# Patient Record
Sex: Female | Born: 1990
Health system: Southern US, Community
[De-identification: ages and names within clinical notes are randomized; demographics above are authoritative.]

## PROBLEM LIST (undated history)

## (undated) ENCOUNTER — Emergency Department (HOSPITAL_COMMUNITY): Admission: EM | Payer: BLUE CROSS/BLUE SHIELD

## (undated) DIAGNOSIS — R87629 Unspecified abnormal cytological findings in specimens from vagina: Secondary | ICD-10-CM

## (undated) DIAGNOSIS — T1490XA Injury, unspecified, initial encounter: Secondary | ICD-10-CM

## (undated) DIAGNOSIS — R569 Unspecified convulsions: Secondary | ICD-10-CM

## (undated) DIAGNOSIS — G43909 Migraine, unspecified, not intractable, without status migrainosus: Secondary | ICD-10-CM

## (undated) DIAGNOSIS — K219 Gastro-esophageal reflux disease without esophagitis: Secondary | ICD-10-CM

## (undated) DIAGNOSIS — K449 Diaphragmatic hernia without obstruction or gangrene: Secondary | ICD-10-CM

## (undated) DIAGNOSIS — A048 Other specified bacterial intestinal infections: Secondary | ICD-10-CM

## (undated) DIAGNOSIS — D649 Anemia, unspecified: Secondary | ICD-10-CM

## (undated) DIAGNOSIS — I219 Acute myocardial infarction, unspecified: Secondary | ICD-10-CM

## (undated) DIAGNOSIS — J302 Other seasonal allergic rhinitis: Secondary | ICD-10-CM

## (undated) HISTORY — DX: Acute myocardial infarction, unspecified: I21.9

## (undated) HISTORY — DX: Injury, unspecified, initial encounter: T14.90XA

## (undated) HISTORY — DX: Diaphragmatic hernia without obstruction or gangrene: K44.9

## (undated) HISTORY — DX: Other seasonal allergic rhinitis: J30.2

## (undated) HISTORY — DX: Gastro-esophageal reflux disease without esophagitis: K21.9

## (undated) HISTORY — DX: Unspecified abnormal cytological findings in specimens from vagina: R87.629

## (undated) HISTORY — DX: Other specified bacterial intestinal infections: A04.8

## (undated) HISTORY — DX: Unspecified convulsions: R56.9

## (undated) HISTORY — DX: Anemia, unspecified: D64.9

## (undated) HISTORY — DX: Migraine, unspecified, not intractable, without status migrainosus: G43.909

---

## 2013-09-18 DIAGNOSIS — I219 Acute myocardial infarction, unspecified: Secondary | ICD-10-CM

## 2013-09-18 HISTORY — DX: Acute myocardial infarction, unspecified: I21.9

## 2016-04-20 HISTORY — PX: ESOPHAGOGASTRODUODENOSCOPY: SHX1529

## 2016-04-20 HISTORY — PX: COLONOSCOPY: SHX174

## 2017-07-22 ENCOUNTER — Other Ambulatory Visit (HOSPITAL_COMMUNITY)
Admission: RE | Admit: 2017-07-22 | Discharge: 2017-07-22 | Disposition: A | Discharge status: Home / Self Care | Payer: BLUE CROSS/BLUE SHIELD | Source: Ambulatory Visit | Attending: Adult Health | Admitting: Adult Health

## 2017-07-22 ENCOUNTER — Encounter: Payer: Self-pay | Admitting: Adult Health

## 2017-07-22 ENCOUNTER — Ambulatory Visit (INDEPENDENT_AMBULATORY_CARE_PROVIDER_SITE_OTHER): Payer: BLUE CROSS/BLUE SHIELD | Admitting: Adult Health

## 2017-07-22 VITALS — BP 110/80 | HR 77 | Ht 63.0 in | Wt 264.0 lb

## 2017-07-22 DIAGNOSIS — N6311 Unspecified lump in the right breast, upper outer quadrant: Secondary | ICD-10-CM | POA: Insufficient documentation

## 2017-07-22 DIAGNOSIS — Z01411 Encounter for gynecological examination (general) (routine) with abnormal findings: Secondary | ICD-10-CM

## 2017-07-22 DIAGNOSIS — F329 Major depressive disorder, single episode, unspecified: Secondary | ICD-10-CM | POA: Diagnosis not present

## 2017-07-22 DIAGNOSIS — Z1151 Encounter for screening for human papillomavirus (HPV): Secondary | ICD-10-CM | POA: Diagnosis not present

## 2017-07-22 DIAGNOSIS — Z01419 Encounter for gynecological examination (general) (routine) without abnormal findings: Secondary | ICD-10-CM | POA: Diagnosis present

## 2017-07-22 DIAGNOSIS — N926 Irregular menstruation, unspecified: Secondary | ICD-10-CM | POA: Insufficient documentation

## 2017-07-22 DIAGNOSIS — Z6841 Body Mass Index (BMI) 40.0 and over, adult: Secondary | ICD-10-CM | POA: Insufficient documentation

## 2017-07-22 DIAGNOSIS — F32A Depression, unspecified: Secondary | ICD-10-CM | POA: Insufficient documentation

## 2017-07-22 DIAGNOSIS — E669 Obesity, unspecified: Secondary | ICD-10-CM | POA: Diagnosis not present

## 2017-07-22 MED ORDER — ESCITALOPRAM OXALATE 10 MG PO TABS: 10.0000 mg | ORAL_TABLET | Freq: Every day | ORAL | 12 refills | Status: DC

## 2017-07-22 NOTE — Progress Notes (Signed)
Patient ID: Jeanne Herring, female   DOB: 1990/08/29, 27 y.o.   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 PCP is Dr Jeanne Herring in [place].    Current Medications, Allergies, Past Medical History, Past Surgical History, Family History and Social History were reviewed in [employer] record.     Review of Systems:  Patient denies any headaches, hearing loss, fatigue, blurred vision, shortness of breath, chest pain, abdominal pain, problems with  urination, or intercourse. No joint pain or mood swings. +irregular periods  Has BMs once a week unless takes meds, has had colonoscopy and EGD.   Physical Exam:BP 110/80 (BP Location: Left Arm, Patient Position: Sitting, Cuff Size: Large)   Pulse 77   Ht 5\' 3"  (1.6 m)   Wt 264 lb (119.7 kg)   BMI 46.77 kg/m  General:  Well developed, well nourished, no acute distress Skin:  Warm and dry,numerous tattoos  Neck:  Midline trachea, normal thyroid, good ROM, no lymphadenopathy Lungs; Clear to auscultation bilaterally Breast:  No dominant palpable mass, retraction, or nipple discharge on left, no retraction or nipple discharge on right, has 2 cm mobile,tender mass at 10 o' clock,2 FB from nipple  Cardiovascular: Regular rate and rhythm Abdomen:  Soft, non tender, no hepatosplenomegaly,obese Pelvic:  External genitalia is normal in appearance, no lesions.  The vagina is normal in appearance.+pink discharge. Urethra has no lesions or masses. The cervix is smooth, pap with GC/CHL performed.  Uterus is felt to be normal size, shape, and contour.  No adnexal masses or tenderness noted.Bladder is non tender, no masses felt. Extremities/musculoskeletal:  No swelling or varicosities noted, no clubbing or cyanosis Psych:  No mood changes, alert and cooperative,seems happy PHQ 9 score 13, denies being suicidal, and  is open to meds. Will try lexapro, she declines counseling, has done that in past.  Impression: 1. Encounter for gynecological examination with Papanicolaou smear of cervix   2. Mass of upper outer quadrant of right breast   3. Depression, unspecified depression type   4. Morbid obesity (HCC)   5. Irregular periods       Plan: Right breast US  4/16 at 4:10 pm Meds ordered this encounter  Medications  . escitalopram (LEXAPRO) 10 MG tablet    Sig: Take 1 tablet (10 mg total) by mouth daily.    Dispense:  30 tablet    Refill:  12    Order Specific Question:   Supervising Provider    Answer:   Jeanne Herring [[id number]]  Follow up in 4 weeks  Physical in 1 year Pap in 3 if normal Try eating more often Try increasing water  Keep period calendar Try miralax daily

## 2017-07-27 LAB — CYTOLOGY - PAP
Adequacy: ABSENT
Chlamydia: NEGATIVE
Diagnosis: NEGATIVE
HPV: NOT DETECTED
Neisseria Gonorrhea: NEGATIVE

## 2017-08-03 ENCOUNTER — Other Ambulatory Visit (HOSPITAL_COMMUNITY): Payer: Self-pay

## 2017-08-10 ENCOUNTER — Ambulatory Visit (HOSPITAL_COMMUNITY)
Admission: RE | Admit: 2017-08-10 | Discharge: 2017-08-10 | Disposition: A | Discharge status: Home / Self Care | Payer: BLUE CROSS/BLUE SHIELD | Source: Ambulatory Visit | Attending: Adult Health | Admitting: Adult Health

## 2017-08-10 ENCOUNTER — Other Ambulatory Visit: Payer: Self-pay | Admitting: Adult Health

## 2017-08-10 DIAGNOSIS — IMO0002 Reserved for concepts with insufficient information to code with codable children: Secondary | ICD-10-CM

## 2017-08-10 DIAGNOSIS — R928 Other abnormal and inconclusive findings on diagnostic imaging of breast: Secondary | ICD-10-CM | POA: Diagnosis not present

## 2017-08-10 DIAGNOSIS — N6311 Unspecified lump in the right breast, upper outer quadrant: Secondary | ICD-10-CM

## 2017-08-10 DIAGNOSIS — R229 Localized swelling, mass and lump, unspecified: Principal | ICD-10-CM

## 2017-08-19 ENCOUNTER — Encounter: Payer: Self-pay | Admitting: Adult Health

## 2017-08-19 ENCOUNTER — Ambulatory Visit: Payer: BLUE CROSS/BLUE SHIELD | Admitting: Adult Health

## 2017-08-19 VITALS — BP 114/70 | HR 100 | Ht 63.0 in | Wt 265.0 lb

## 2017-08-19 DIAGNOSIS — Z131 Encounter for screening for diabetes mellitus: Secondary | ICD-10-CM | POA: Diagnosis not present

## 2017-08-19 DIAGNOSIS — F32A Depression, unspecified: Secondary | ICD-10-CM

## 2017-08-19 DIAGNOSIS — F329 Major depressive disorder, single episode, unspecified: Secondary | ICD-10-CM | POA: Diagnosis not present

## 2017-08-19 DIAGNOSIS — R635 Abnormal weight gain: Secondary | ICD-10-CM

## 2017-08-19 DIAGNOSIS — Z889 Allergy status to unspecified drugs, medicaments and biological substances status: Secondary | ICD-10-CM | POA: Insufficient documentation

## 2017-08-19 DIAGNOSIS — L680 Hirsutism: Secondary | ICD-10-CM

## 2017-08-19 MED ORDER — SPIRONOLACTONE 100 MG PO TABS: 100.0000 mg | ORAL_TABLET | Freq: Every day | ORAL | 2 refills | Status: DC

## 2017-08-19 NOTE — Progress Notes (Signed)
  Subjective:     Patient ID: Jeanne Herring, female   DOB: May 06, 1990, 27 y.o.   MRN: [id number]  HPI Khaliah is a 27 year old black female in for follow up on starting lexapro and is better, but having crazy dreams.She had mammogram, was negative but had fatty tissue.She is requesting meds for allergy cough and facial hair.Has gained weight, and is eating more fruit and drinking water.  Review of Systems Feels better on lexapro, but having crazy dreams +hair on face and chest +cough +weight gain   Reviewed past medical,surgical, social and family history. Reviewed medications and allergies.  Objective:   Physical Exam BP 114/70 (BP Location: Left Arm, Patient Position: Sitting, Cuff Size: Large)   Pulse 100   Ht 5\' 3"  (1.6 m)   Wt 265 lb (120.2 kg)   LMP 07/26/2017   BMI 46.94 kg/m  Skin warm and dry. Lungs: clear to ausculation bilaterally. Cardiovascular: regular rate and rhythm.+facial hair and on chest, has used Darene Lamer. She does have apple shape. PHQ 2 score 0 today, PHQ 9 score was 13, on 4/4.   Will continue lexapro for now and Rx spirolactone to see if helps hair growth.   Assessment:     1. Depression, unspecified depression type   2. Hirsutism   3. H/O seasonal allergies   4. Weight gain   5. Screening for diabetes mellitus       Plan:     Check CBC,CMP,TSH and A1c Continue lexapro Meds ordered this encounter  Medications  . spironolactone (ALDACTONE) 100 MG tablet    Sig: Take 1 tablet (100 mg total) by mouth daily.    Dispense:  30 tablet    Refill:  2    Order Specific Question:   Supervising Provider    Answer:   Duane Lope H [[id number]]  F/U in 8 weeks  Try zyrtec and delsym  Try [employer] app

## 2017-08-20 LAB — COMPREHENSIVE METABOLIC PANEL
ALT: 39 IU/L — ABNORMAL HIGH (ref 0–32)
AST: 39 IU/L (ref 0–40)
Albumin/Globulin Ratio: 1.2 (ref 1.2–2.2)
Albumin: 4.3 g/dL (ref 3.5–5.5)
Alkaline Phosphatase: 81 IU/L (ref 39–117)
BUN/Creatinine Ratio: 5 — ABNORMAL LOW (ref 9–23)
BUN: 4 mg/dL — ABNORMAL LOW (ref 6–20)
Bilirubin Total: 0.5 mg/dL (ref 0.0–1.2)
CO2: 25 mmol/L (ref 20–29)
Calcium: 9.3 mg/dL (ref 8.7–10.2)
Chloride: 100 mmol/L (ref 96–106)
Creatinine, Ser: 0.78 mg/dL (ref 0.57–1.00)
GFR calc Af Amer: 120 mL/min/{1.73_m2} (ref >59)
GFR calc non Af Amer: 104 mL/min/{1.73_m2} (ref >59)
Globulin, Total: 3.6 g/dL (ref 1.5–4.5)
Glucose: 96 mg/dL (ref 65–99)
Potassium: 4.2 mmol/L (ref 3.5–5.2)
Sodium: 139 mmol/L (ref 134–144)
Total Protein: 7.9 g/dL (ref 6.0–8.5)

## 2017-08-20 LAB — HEMOGLOBIN A1C
Est. average glucose Bld gHb Est-mCnc: 114 mg/dL
Hgb A1c MFr Bld: 5.6 % (ref 4.8–5.6)

## 2017-08-20 LAB — CBC
Hematocrit: 43.4 % (ref 34.0–46.6)
Hemoglobin: 13.3 g/dL (ref 11.1–15.9)
MCH: 24.1 pg — ABNORMAL LOW (ref 26.6–33.0)
MCHC: 30.6 g/dL — ABNORMAL LOW (ref 31.5–35.7)
MCV: 79 fL (ref 79–97)
Platelets: 205 10*3/uL (ref 150–379)
RBC: 5.51 x10E6/uL — ABNORMAL HIGH (ref 3.77–5.28)
RDW: 15.1 % (ref 12.3–15.4)
WBC: 9.2 10*3/uL (ref 3.4–10.8)

## 2017-08-20 LAB — TSH: TSH: 0.642 u[IU]/mL (ref 0.450–4.500)

## 2017-08-25 ENCOUNTER — Telehealth: Payer: Self-pay | Admitting: *Deleted

## 2017-08-25 NOTE — Telephone Encounter (Signed)
Left message about labs and to call me

## 2017-08-26 ENCOUNTER — Telehealth: Payer: Self-pay | Admitting: Adult Health

## 2017-08-26 ENCOUNTER — Telehealth: Payer: Self-pay | Admitting: *Deleted

## 2017-08-26 MED ORDER — SERTRALINE HCL 50 MG PO TABS: 50.0000 mg | ORAL_TABLET | Freq: Every day | ORAL | 1 refills | Status: DC

## 2017-08-26 NOTE — Telephone Encounter (Signed)
Pt having vivid dreams with lexapro, will change to zoloft

## 2017-08-26 NOTE — Telephone Encounter (Signed)
Jeanne Herring to talk with patient.

## 2017-09-08 ENCOUNTER — Telehealth: Payer: Self-pay | Admitting: Adult Health

## 2017-09-08 NOTE — Telephone Encounter (Signed)
Pt called and stated that she started her period on 4/26 and has been bleeding since that time. It is not always heavy, sometimes when she goes to check pad it is dry but she does see some bleeding everyday. Pt is not on birth control. Pt advised to make an appointment to see Victorino Dike prior to the appt she has made in June. Patient agrees. Transferred to front desk to make appt.

## 2017-09-08 NOTE — Telephone Encounter (Signed)
Patient called stating that she has been bleeding for a long time and would like to speak with a nurse regarding this. Please contact pt

## 2017-09-20 ENCOUNTER — Telehealth: Payer: Self-pay | Admitting: Adult Health

## 2017-09-20 NOTE — Telephone Encounter (Signed)
Pt called stating that she has been bleeding since 4/26. She states that she has an appt with Victorino Dike on 6/11. She first stated that the bleeding had increased and she was changing 4-5 pads a day. She then states that she had been changing a pad every 30 min to an hour yesterday. Advised that I would transfer her to appts to see if we had anything sooner. Advised that if she was still changing pads every 30 min to 1 hour then she should go to urgent care or the ED. Pt verbalized understanding.

## 2017-09-20 NOTE — Telephone Encounter (Signed)
PT CALLED AND IS WANTING A NURSE TO GIVE HER A CALL

## 2017-09-21 ENCOUNTER — Encounter (HOSPITAL_COMMUNITY): Payer: Self-pay | Admitting: Emergency Medicine

## 2017-09-21 ENCOUNTER — Emergency Department (HOSPITAL_COMMUNITY): Payer: BLUE CROSS/BLUE SHIELD

## 2017-09-21 ENCOUNTER — Other Ambulatory Visit: Payer: Self-pay

## 2017-09-21 ENCOUNTER — Emergency Department (HOSPITAL_COMMUNITY)
Admission: EM | Admit: 2017-09-21 | Discharge: 2017-09-22 | Disposition: A | Discharge status: Home / Self Care | Payer: BLUE CROSS/BLUE SHIELD | Attending: Emergency Medicine | Admitting: Emergency Medicine

## 2017-09-21 DIAGNOSIS — M542 Cervicalgia: Secondary | ICD-10-CM | POA: Insufficient documentation

## 2017-09-21 DIAGNOSIS — Z79899 Other long term (current) drug therapy: Secondary | ICD-10-CM | POA: Insufficient documentation

## 2017-09-21 DIAGNOSIS — I252 Old myocardial infarction: Secondary | ICD-10-CM | POA: Insufficient documentation

## 2017-09-21 DIAGNOSIS — Z91040 Latex allergy status: Secondary | ICD-10-CM | POA: Insufficient documentation

## 2017-09-21 DIAGNOSIS — N760 Acute vaginitis: Secondary | ICD-10-CM | POA: Insufficient documentation

## 2017-09-21 DIAGNOSIS — B9689 Other specified bacterial agents as the cause of diseases classified elsewhere: Secondary | ICD-10-CM

## 2017-09-21 DIAGNOSIS — N939 Abnormal uterine and vaginal bleeding, unspecified: Secondary | ICD-10-CM

## 2017-09-21 LAB — URINALYSIS, ROUTINE W REFLEX MICROSCOPIC
Bacteria, UA: NONE SEEN
Bilirubin Urine: NEGATIVE
Glucose, UA: NEGATIVE mg/dL
Ketones, ur: NEGATIVE mg/dL
Leukocytes, UA: NEGATIVE
Nitrite: NEGATIVE
Protein, ur: NEGATIVE mg/dL
Specific Gravity, Urine: 1.011 (ref 1.005–1.030)
pH: 7 (ref 5.0–8.0)

## 2017-09-21 LAB — COMPREHENSIVE METABOLIC PANEL
ALT: 39 U/L (ref 14–54)
AST: 35 U/L (ref 15–41)
Albumin: 3.7 g/dL (ref 3.5–5.0)
Alkaline Phosphatase: 63 U/L (ref 38–126)
Anion gap: 7 (ref 5–15)
BUN: 6 mg/dL (ref 6–20)
CO2: 26 mmol/L (ref 22–32)
Calcium: 9.2 mg/dL (ref 8.9–10.3)
Chloride: 105 mmol/L (ref 101–111)
Creatinine, Ser: 0.69 mg/dL (ref 0.44–1.00)
GFR calc Af Amer: >60 mL/min (ref >60)
GFR calc non Af Amer: >60 mL/min (ref >60)
Glucose, Bld: 111 mg/dL — ABNORMAL HIGH (ref 65–99)
Potassium: 3.7 mmol/L (ref 3.5–5.1)
Sodium: 138 mmol/L (ref 135–145)
Total Bilirubin: 0.4 mg/dL (ref 0.3–1.2)
Total Protein: 7.7 g/dL (ref 6.5–8.1)

## 2017-09-21 LAB — CBC WITH DIFFERENTIAL/PLATELET
Basophils Absolute: 0 10*3/uL (ref 0.0–0.1)
Basophils Relative: 0 %
Eosinophils Absolute: 0.1 10*3/uL (ref 0.0–0.7)
Eosinophils Relative: 2 %
HCT: 37.2 % (ref 36.0–46.0)
Hemoglobin: 11.5 g/dL — ABNORMAL LOW (ref 12.0–15.0)
Lymphocytes Relative: 41 %
Lymphs Abs: 3.2 10*3/uL (ref 0.7–4.0)
MCH: 24.2 pg — ABNORMAL LOW (ref 26.0–34.0)
MCHC: 30.9 g/dL (ref 30.0–36.0)
MCV: 78.2 fL (ref 78.0–100.0)
Monocytes Absolute: 0.6 10*3/uL (ref 0.1–1.0)
Monocytes Relative: 7 %
Neutro Abs: 4 10*3/uL (ref 1.7–7.7)
Neutrophils Relative %: 50 %
Platelets: 214 10*3/uL (ref 150–400)
RBC: 4.76 MIL/uL (ref 3.87–5.11)
RDW: 13.4 % (ref 11.5–15.5)
WBC: 7.9 10*3/uL (ref 4.0–10.5)

## 2017-09-21 LAB — WET PREP, GENITAL
Sperm: NONE SEEN
Trich, Wet Prep: NONE SEEN
Yeast Wet Prep HPF POC: NONE SEEN

## 2017-09-21 LAB — POC URINE PREG, ED: Preg Test, Ur: NEGATIVE

## 2017-09-21 LAB — POC OCCULT BLOOD, ED: Fecal Occult Bld: NEGATIVE

## 2017-09-21 MED ORDER — IOPAMIDOL (ISOVUE-370) INJECTION 76%
75.0000 mL | Freq: Once | INTRAVENOUS | Status: AC | PRN
  Administered 2017-09-21: 75 mL via INTRAVENOUS

## 2017-09-21 MED ORDER — METRONIDAZOLE 500 MG PO TABS
500.0000 mg | ORAL_TABLET | Freq: Once | ORAL | Status: AC
  Administered 2017-09-21: 500 mg via ORAL
  Filled 2017-09-21: qty 1

## 2017-09-21 MED ORDER — METRONIDAZOLE 500 MG PO TABS: 500.0000 mg | ORAL_TABLET | Freq: Two times a day (BID) | ORAL | 0 refills | Status: DC

## 2017-09-21 NOTE — ED Triage Notes (Signed)
Vaginal bleeding since April 26th. Also complaining of bleeding per rectum. Pt also having complaints right side of neck pain for past two hours.
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
## 2017-09-23 LAB — GC/CHLAMYDIA PROBE AMP (~~LOC~~) NOT AT ARMC
Chlamydia: NEGATIVE
Neisseria Gonorrhea: NEGATIVE

## 2017-09-24 ENCOUNTER — Ambulatory Visit: Payer: BLUE CROSS/BLUE SHIELD | Admitting: Adult Health

## 2017-09-28 ENCOUNTER — Ambulatory Visit (INDEPENDENT_AMBULATORY_CARE_PROVIDER_SITE_OTHER): Payer: BLUE CROSS/BLUE SHIELD | Admitting: Adult Health

## 2017-09-28 ENCOUNTER — Encounter: Payer: Self-pay | Admitting: Adult Health

## 2017-09-28 ENCOUNTER — Other Ambulatory Visit: Payer: Self-pay

## 2017-09-28 VITALS — BP 112/69 | HR 73 | Ht 63.0 in | Wt 265.0 lb

## 2017-09-28 DIAGNOSIS — M79601 Pain in right arm: Secondary | ICD-10-CM | POA: Diagnosis not present

## 2017-09-28 DIAGNOSIS — R112 Nausea with vomiting, unspecified: Secondary | ICD-10-CM | POA: Diagnosis not present

## 2017-09-28 DIAGNOSIS — N939 Abnormal uterine and vaginal bleeding, unspecified: Secondary | ICD-10-CM | POA: Diagnosis not present

## 2017-09-28 DIAGNOSIS — R1011 Right upper quadrant pain: Secondary | ICD-10-CM

## 2017-09-28 NOTE — Progress Notes (Signed)
  Subjective:     Patient ID: Jeanne Herring, female   DOB: May 07, 1990, 27 y.o.   MRN: [id number]  HPI Jeanne Herring is a 27 year old black female in complaining of bleeding since 4/26 but stopped yesterday, was seen in ER 09/21/17 and treated for BV.  PCP is Dr Mayford Knife in [place].   Review of Systems +bleeding, but stopped yesterday +stomach and back pain, with nausea and vomiting at times Pain in right arm, feels numb and has knot near wrist Reviewed past medical,surgical, social and family history. Reviewed medications and allergies.     Objective:   Physical Exam BP 112/69 (BP Location: Right Arm, Patient Position: Sitting, Cuff Size: Large)   Pulse 73   Ht 5\' 3"  (1.6 m)   Wt 265 lb (120.2 kg)   LMP 08/13/2017   BMI 46.94 kg/m  Skin warm and dry.Pelvic: external genitalia is normal in appearance no lesions, vagina: no discharge or odor,urethra has no lesions or masses noted, cervix:smooth, no CMT, uterus: normal size, shape and contour, non tender, no masses felt, adnexa: no masses or tenderness noted. Bladder is non tender and no masses felt. Abdomen is soft and has tenderness RUQ and epigastric area, no masses felt, no CVAT but tender low back. Has ?cyst on ulna near wrist on right.    Assessment:     1. Abnormal uterine bleeding (AUB)   2. RUQ pain   3. Nausea and vomiting, intractability of vomiting not specified, unspecified vomiting type   4. Pain of right upper extremity       Plan:     Abdominal complete and pelvic US scheduled at [facility] 6/14 at 8:15 am Referred to Dr Romeo Apple F/U in 2 weeks

## 2017-09-29 ENCOUNTER — Telehealth: Payer: Self-pay | Admitting: Adult Health

## 2017-09-29 NOTE — Telephone Encounter (Signed)
Would like to talk toJennifer about getting a referral to a GI DR

## 2017-09-29 NOTE — Telephone Encounter (Signed)
Left message that will wait til after [place] on Friday, to see what results are, before making GI referral

## 2017-10-01 ENCOUNTER — Ambulatory Visit (HOSPITAL_COMMUNITY)
Admission: RE | Admit: 2017-10-01 | Discharge: 2017-10-01 | Disposition: A | Discharge status: Home / Self Care | Payer: BLUE CROSS/BLUE SHIELD | Source: Ambulatory Visit | Attending: Adult Health | Admitting: Adult Health

## 2017-10-01 ENCOUNTER — Encounter: Payer: Self-pay | Admitting: Adult Health

## 2017-10-01 DIAGNOSIS — R1011 Right upper quadrant pain: Secondary | ICD-10-CM | POA: Insufficient documentation

## 2017-10-01 DIAGNOSIS — K760 Fatty (change of) liver, not elsewhere classified: Secondary | ICD-10-CM | POA: Insufficient documentation

## 2017-10-01 DIAGNOSIS — R112 Nausea with vomiting, unspecified: Secondary | ICD-10-CM | POA: Diagnosis present

## 2017-10-01 DIAGNOSIS — N939 Abnormal uterine and vaginal bleeding, unspecified: Secondary | ICD-10-CM

## 2017-10-06 ENCOUNTER — Telehealth: Payer: Self-pay | Admitting: Adult Health

## 2017-10-06 DIAGNOSIS — R1011 Right upper quadrant pain: Secondary | ICD-10-CM

## 2017-10-06 DIAGNOSIS — K760 Fatty (change of) liver, not elsewhere classified: Secondary | ICD-10-CM

## 2017-10-06 NOTE — Telephone Encounter (Signed)
Pt is calling back to check on getting a referral for a GI dr

## 2017-10-06 NOTE — Telephone Encounter (Signed)
Referral placed for GI, Dr Darrick Penna to evaluate fatty liver and RUQ pain

## 2017-10-09 ENCOUNTER — Encounter: Payer: Self-pay | Admitting: Adult Health

## 2017-10-11 ENCOUNTER — Encounter: Payer: Self-pay | Admitting: Adult Health

## 2017-10-12 ENCOUNTER — Ambulatory Visit (INDEPENDENT_AMBULATORY_CARE_PROVIDER_SITE_OTHER): Payer: BLUE CROSS/BLUE SHIELD | Admitting: Adult Health

## 2017-10-12 ENCOUNTER — Ambulatory Visit: Payer: BLUE CROSS/BLUE SHIELD | Admitting: Orthopaedic Surgery

## 2017-10-12 ENCOUNTER — Encounter: Payer: Self-pay | Admitting: Adult Health

## 2017-10-12 ENCOUNTER — Encounter: Payer: Self-pay | Admitting: Gastroenterology

## 2017-10-12 VITALS — BP 121/69 | HR 79 | Ht 63.0 in | Wt 261.0 lb

## 2017-10-12 DIAGNOSIS — K760 Fatty (change of) liver, not elsewhere classified: Secondary | ICD-10-CM | POA: Diagnosis not present

## 2017-10-12 DIAGNOSIS — R1011 Right upper quadrant pain: Secondary | ICD-10-CM

## 2017-10-12 DIAGNOSIS — L680 Hirsutism: Secondary | ICD-10-CM | POA: Diagnosis not present

## 2017-10-12 NOTE — Progress Notes (Signed)
  Subjective:     Patient ID: Jeanne Herring, female   DOB: 1990-11-17, 27 y.o.   MRN: [id number]  HPI Jeanne Herring is a 27 year old black female in for F/U on [place]. Pelvic US normal and abdominal US showed fatty liver. And is on spirolactone for facial hair.  PCP is Dr Mayford Knife.   Review of Systems Still has hair on face +RUQ pain No period since  Bleeding stopped end of May  +back pain Moods better  Reviewed past medical,surgical, social and family history. Reviewed medications and allergies.     Objective:   Physical Exam BP 121/69 (BP Location: Left Arm, Patient Position: Sitting, Cuff Size: Large)   Pulse 79   Ht 5\' 3"  (1.6 m)   Wt 261 lb (118.4 kg)   LMP 08/13/2017 Comment: bleeding stopped end of may  BMI 46.23 kg/m    Skin warm and dry, still has facial hair, abdomen is soft and tender RUQ, has fatty liver on [place] and has appt with Dr Darrick Penna. And has appt with Dr Hilda Lias in July for wrist.then she says back has been hurting and talked with Dr Mayford Knife, can talk it over with Dr Hilda Lias too. Will see back in 4 weeks to assess bleeding profile.  Assessment:     1. Fatty liver   2. RUQ pain   3. Hirsutism       Plan:     F/U in 4 weeks  Continue spirolactone

## 2017-10-14 ENCOUNTER — Ambulatory Visit: Payer: BLUE CROSS/BLUE SHIELD | Admitting: Adult Health

## 2017-10-22 ENCOUNTER — Encounter: Payer: Self-pay | Admitting: Gastroenterology

## 2017-10-23 ENCOUNTER — Emergency Department: Payer: BLUE CROSS/BLUE SHIELD

## 2017-10-23 ENCOUNTER — Emergency Department
Admission: EM | Admit: 2017-10-23 | Discharge: 2017-10-23 | Disposition: A | Discharge status: Home / Self Care | Payer: BLUE CROSS/BLUE SHIELD | Attending: Emergency Medicine | Admitting: Emergency Medicine

## 2017-10-23 ENCOUNTER — Encounter: Payer: Self-pay | Admitting: Emergency Medicine

## 2017-10-23 DIAGNOSIS — Z91040 Latex allergy status: Secondary | ICD-10-CM | POA: Diagnosis not present

## 2017-10-23 DIAGNOSIS — Z79899 Other long term (current) drug therapy: Secondary | ICD-10-CM | POA: Diagnosis not present

## 2017-10-23 DIAGNOSIS — H11422 Conjunctival edema, left eye: Secondary | ICD-10-CM

## 2017-10-23 DIAGNOSIS — R51 Headache: Secondary | ICD-10-CM | POA: Diagnosis present

## 2017-10-23 DIAGNOSIS — G43109 Migraine with aura, not intractable, without status migrainosus: Secondary | ICD-10-CM

## 2017-10-23 DIAGNOSIS — H1132 Conjunctival hemorrhage, left eye: Secondary | ICD-10-CM | POA: Insufficient documentation

## 2017-10-23 LAB — COMPREHENSIVE METABOLIC PANEL
ALT: 32 U/L (ref 0–44)
AST: 34 U/L (ref 15–41)
Albumin: 3.9 g/dL (ref 3.5–5.0)
Alkaline Phosphatase: 61 U/L (ref 38–126)
Anion gap: 9 (ref 5–15)
BUN: 7 mg/dL (ref 6–20)
CO2: 25 mmol/L (ref 22–32)
Calcium: 9 mg/dL (ref 8.9–10.3)
Chloride: 104 mmol/L (ref 98–111)
Creatinine, Ser: 0.77 mg/dL (ref 0.44–1.00)
GFR calc Af Amer: >60 mL/min (ref >60)
GFR calc non Af Amer: >60 mL/min (ref >60)
Glucose, Bld: 80 mg/dL (ref 70–99)
Potassium: 3.7 mmol/L (ref 3.5–5.1)
Sodium: 138 mmol/L (ref 135–145)
Total Bilirubin: 1.1 mg/dL (ref 0.3–1.2)
Total Protein: 7.8 g/dL (ref 6.5–8.1)

## 2017-10-23 LAB — CBC WITH DIFFERENTIAL/PLATELET
Basophils Absolute: 0.1 10*3/uL (ref 0–0.1)
Basophils Relative: 1 %
Eosinophils Absolute: 0 10*3/uL (ref 0–0.7)
Eosinophils Relative: 0 %
HCT: 40.2 % (ref 35.0–47.0)
Hemoglobin: 12.9 g/dL (ref 12.0–16.0)
Lymphocytes Relative: 24 %
Lymphs Abs: 2.6 10*3/uL (ref 1.0–3.6)
MCH: 24.7 pg — ABNORMAL LOW (ref 26.0–34.0)
MCHC: 32.2 g/dL (ref 32.0–36.0)
MCV: 76.9 fL — ABNORMAL LOW (ref 80.0–100.0)
Monocytes Absolute: 0.7 10*3/uL (ref 0.2–0.9)
Monocytes Relative: 6 %
Neutro Abs: 7.2 10*3/uL — ABNORMAL HIGH (ref 1.4–6.5)
Neutrophils Relative %: 69 %
Platelets: 214 10*3/uL (ref 150–440)
RBC: 5.22 MIL/uL — ABNORMAL HIGH (ref 3.80–5.20)
RDW: 13.5 % (ref 11.5–14.5)
WBC: 10.6 10*3/uL (ref 3.6–11.0)

## 2017-10-23 MED ORDER — KETOROLAC TROMETHAMINE 30 MG/ML IJ SOLN
30.0000 mg | Freq: Once | INTRAMUSCULAR | Status: AC
  Administered 2017-10-23: 30 mg via INTRAVENOUS
  Filled 2017-10-23: qty 1

## 2017-10-23 MED ORDER — IOHEXOL 300 MG/ML  SOLN
75.0000 mL | Freq: Once | INTRAMUSCULAR | Status: AC | PRN
  Administered 2017-10-23: 75 mL via INTRAVENOUS
  Filled 2017-10-23: qty 75

## 2017-10-23 MED ORDER — PROMETHAZINE HCL 25 MG/ML IJ SOLN
25.0000 mg | Freq: Once | INTRAMUSCULAR | Status: AC
  Administered 2017-10-23: 25 mg via INTRAVENOUS
  Filled 2017-10-23: qty 1

## 2017-10-23 MED ORDER — FLUORESCEIN SODIUM 1 MG OP STRP
1.0000 | ORAL_STRIP | Freq: Once | OPHTHALMIC | Status: AC
  Administered 2017-10-23: 1 via OPHTHALMIC
  Filled 2017-10-23: qty 1

## 2017-10-23 MED ORDER — DIPHENHYDRAMINE HCL 50 MG/ML IJ SOLN
50.0000 mg | Freq: Once | INTRAMUSCULAR | Status: AC
  Administered 2017-10-23: 50 mg via INTRAVENOUS
  Filled 2017-10-23: qty 1

## 2017-10-23 MED ORDER — TETRACAINE HCL 0.5 % OP SOLN
2.0000 [drp] | Freq: Once | OPHTHALMIC | Status: AC
  Administered 2017-10-23: 2 [drp] via OPHTHALMIC
  Filled 2017-10-23: qty 4

## 2017-10-23 NOTE — ED Notes (Signed)
ED Provider at bedside. 
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
## 2017-10-23 NOTE — ED Notes (Signed)
Pt ambulatory to wheel chair upon discharge. Verbalized understanding of discharge instructions and follow-up care. VSS. Skin warm and dry. A&O x4.  

## 2017-10-23 NOTE — ED Notes (Signed)
Pt back in room from CT scan.

## 2017-10-23 NOTE — ED Triage Notes (Signed)
Patient presents to the ED from Urgent Care for left eye pain, redness and swelling.  Patient states pain began 2 days ago but swelling began yesterday.  Patient is in no obvious distress at this time.

## 2017-11-02 ENCOUNTER — Ambulatory Visit: Payer: BLUE CROSS/BLUE SHIELD | Admitting: Orthopaedic Surgery

## 2017-11-02 ENCOUNTER — Telehealth: Payer: Self-pay | Admitting: Radiology

## 2017-11-02 ENCOUNTER — Ambulatory Visit (INDEPENDENT_AMBULATORY_CARE_PROVIDER_SITE_OTHER): Payer: BLUE CROSS/BLUE SHIELD

## 2017-11-02 ENCOUNTER — Encounter: Payer: Self-pay | Admitting: Orthopaedic Surgery

## 2017-11-02 VITALS — BP 107/73 | HR 68 | Ht 64.0 in | Wt 259.0 lb

## 2017-11-02 DIAGNOSIS — M25511 Pain in right shoulder: Secondary | ICD-10-CM

## 2017-11-02 DIAGNOSIS — Z6841 Body Mass Index (BMI) 40.0 and over, adult: Secondary | ICD-10-CM | POA: Diagnosis not present

## 2017-11-02 DIAGNOSIS — S43006A Unspecified dislocation of unspecified shoulder joint, initial encounter: Secondary | ICD-10-CM

## 2017-11-02 NOTE — Telephone Encounter (Signed)
Called to schedule MRI at [facility] Wed 24th at [facility] arrive at 11:30 Called patient to advise.
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
## 2017-11-08 ENCOUNTER — Encounter: Payer: Self-pay | Admitting: Adult Health

## 2017-11-09 ENCOUNTER — Ambulatory Visit: Payer: BLUE CROSS/BLUE SHIELD | Admitting: Adult Health

## 2017-11-10 ENCOUNTER — Ambulatory Visit (HOSPITAL_COMMUNITY)
Admission: RE | Admit: 2017-11-10 | Discharge: 2017-11-10 | Disposition: A | Discharge status: Home / Self Care | Payer: BLUE CROSS/BLUE SHIELD | Source: Ambulatory Visit | Attending: Orthopaedic Surgery | Admitting: Orthopaedic Surgery

## 2017-11-10 DIAGNOSIS — M25511 Pain in right shoulder: Secondary | ICD-10-CM

## 2017-11-10 DIAGNOSIS — S43401A Unspecified sprain of right shoulder joint, initial encounter: Secondary | ICD-10-CM | POA: Diagnosis not present

## 2017-11-10 DIAGNOSIS — S43004A Unspecified dislocation of right shoulder joint, initial encounter: Secondary | ICD-10-CM | POA: Diagnosis not present

## 2017-11-10 DIAGNOSIS — S43006A Unspecified dislocation of unspecified shoulder joint, initial encounter: Secondary | ICD-10-CM

## 2017-11-10 DIAGNOSIS — X58XXXA Exposure to other specified factors, initial encounter: Secondary | ICD-10-CM | POA: Diagnosis not present

## 2017-11-11 ENCOUNTER — Encounter: Payer: Self-pay | Admitting: Orthopaedic Surgery

## 2017-11-11 ENCOUNTER — Ambulatory Visit: Payer: BLUE CROSS/BLUE SHIELD | Admitting: Orthopaedic Surgery

## 2017-11-11 VITALS — Ht 64.0 in | Wt 260.0 lb

## 2017-11-11 DIAGNOSIS — Z6841 Body Mass Index (BMI) 40.0 and over, adult: Secondary | ICD-10-CM | POA: Diagnosis not present

## 2017-11-11 DIAGNOSIS — M25511 Pain in right shoulder: Secondary | ICD-10-CM | POA: Diagnosis not present

## 2017-11-11 DIAGNOSIS — S43006A Unspecified dislocation of unspecified shoulder joint, initial encounter: Secondary | ICD-10-CM | POA: Diagnosis not present
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
## 2017-11-11 NOTE — Patient Instructions (Signed)
[facility] Dr. Dorene Grebe

## 2017-11-12 ENCOUNTER — Encounter (INDEPENDENT_AMBULATORY_CARE_PROVIDER_SITE_OTHER): Payer: Self-pay | Admitting: Orthopedic Surgery

## 2017-11-12 ENCOUNTER — Ambulatory Visit (INDEPENDENT_AMBULATORY_CARE_PROVIDER_SITE_OTHER): Payer: BLUE CROSS/BLUE SHIELD | Admitting: Orthopedic Surgery

## 2017-11-12 DIAGNOSIS — M25311 Other instability, right shoulder: Secondary | ICD-10-CM
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
## 2017-11-17 ENCOUNTER — Ambulatory Visit: Payer: BLUE CROSS/BLUE SHIELD | Admitting: Orthopaedic Surgery

## 2017-12-08 ENCOUNTER — Encounter (INDEPENDENT_AMBULATORY_CARE_PROVIDER_SITE_OTHER): Payer: Self-pay | Admitting: Orthopedic Surgery

## 2017-12-09 NOTE — Telephone Encounter (Signed)
Otc meds tylenol and nsaids would not do the narcotic route with this

## 2017-12-15 ENCOUNTER — Ambulatory Visit: Payer: BLUE CROSS/BLUE SHIELD | Admitting: Adult Health

## 2017-12-15 ENCOUNTER — Encounter: Payer: Self-pay | Admitting: Adult Health

## 2017-12-15 VITALS — BP 132/92 | HR 82 | Ht 64.0 in | Wt 265.0 lb

## 2017-12-15 DIAGNOSIS — K760 Fatty (change of) liver, not elsewhere classified: Secondary | ICD-10-CM

## 2017-12-15 DIAGNOSIS — Z3202 Encounter for pregnancy test, result negative: Secondary | ICD-10-CM | POA: Insufficient documentation

## 2017-12-15 DIAGNOSIS — N898 Other specified noninflammatory disorders of vagina: Secondary | ICD-10-CM | POA: Diagnosis not present

## 2017-12-15 DIAGNOSIS — N760 Acute vaginitis: Secondary | ICD-10-CM | POA: Diagnosis not present

## 2017-12-15 DIAGNOSIS — N6311 Unspecified lump in the right breast, upper outer quadrant: Secondary | ICD-10-CM | POA: Diagnosis not present

## 2017-12-15 DIAGNOSIS — L680 Hirsutism: Secondary | ICD-10-CM

## 2017-12-15 DIAGNOSIS — B9689 Other specified bacterial agents as the cause of diseases classified elsewhere: Secondary | ICD-10-CM | POA: Diagnosis not present

## 2017-12-15 DIAGNOSIS — N926 Irregular menstruation, unspecified: Secondary | ICD-10-CM | POA: Insufficient documentation

## 2017-12-15 LAB — POCT WET PREP (WET MOUNT)
Clue Cells Wet Prep Whiff POC: NEGATIVE
Trichomonas Wet Prep HPF POC: ABSENT
WBC, Wet Prep HPF POC: POSITIVE

## 2017-12-15 LAB — POCT URINE PREGNANCY: Preg Test, Ur: NEGATIVE

## 2017-12-15 MED ORDER — MEDROXYPROGESTERONE ACETATE 10 MG PO TABS: 10.0000 mg | ORAL_TABLET | Freq: Every day | ORAL | 0 refills | Status: AC

## 2017-12-15 MED ORDER — METRONIDAZOLE 500 MG PO TABS: 500.0000 mg | ORAL_TABLET | Freq: Two times a day (BID) | ORAL | 0 refills | Status: AC

## 2017-12-15 NOTE — Patient Instructions (Signed)
Bacterial Vaginosis Bacterial vaginosis is a vaginal infection that occurs when the normal balance of bacteria in the vagina is disrupted. It results from an overgrowth of certain bacteria. This is the most common vaginal infection among women ages 15-44. Because bacterial vaginosis increases your risk for STIs (sexually transmitted infections), getting treated can help reduce your risk for chlamydia, gonorrhea, herpes, and HIV (human immunodeficiency virus). Treatment is also important for preventing complications in pregnant women, because this condition can cause an early (premature) delivery. What are the causes? This condition is caused by an increase in harmful bacteria that are normally present in small amounts in the vagina. However, the reason that the condition develops is not fully understood. What increases the risk? The following factors may make you more likely to develop this condition:  Having a new sexual partner or multiple sexual partners.  Having unprotected sex.  Douching.  Having an intrauterine device (IUD).  Smoking.  Drug and alcohol abuse.  Taking certain antibiotic medicines.  Being pregnant.  You cannot get bacterial vaginosis from toilet seats, bedding, swimming pools, or contact with objects around you. What are the signs or symptoms? Symptoms of this condition include:  Grey or white vaginal discharge. The discharge can also be watery or foamy.  A fish-like odor with discharge, especially after sexual intercourse or during menstruation.  Itching in and around the vagina.  Burning or pain with urination.  Some women with bacterial vaginosis have no signs or symptoms. How is this diagnosed? This condition is diagnosed based on:  Your medical history.  A physical exam of the vagina.  Testing a sample of vaginal fluid under a microscope to look for a large amount of bad bacteria or abnormal cells. Your health care provider may use a cotton swab  or a small wooden spatula to collect the sample.  How is this treated? This condition is treated with antibiotics. These may be given as a pill, a vaginal cream, or a medicine that is put into the vagina (suppository). If the condition comes back after treatment, a second round of antibiotics may be needed. Follow these instructions at home: Medicines  Take over-the-counter and prescription medicines only as told by your health care provider.  Take or use your antibiotic as told by your health care provider. Do not stop taking or using the antibiotic even if you start to feel better. General instructions  If you have a female sexual partner, tell her that you have a vaginal infection. She should see her health care provider and be treated if she has symptoms. If you have a female sexual partner, he does not need treatment.  During treatment: ? Avoid sexual activity until you finish treatment. ? Do not douche. ? Avoid alcohol as directed by your health care provider. ? Avoid breastfeeding as directed by your health care provider.  Drink enough water and fluids to keep your urine clear or pale yellow.  Keep the area around your vagina and rectum clean. ? Wash the area daily with warm water. ? Wipe yourself from front to back after using the toilet.  Keep all follow-up visits as told by your health care provider. This is important. How is this prevented?  Do not douche.  Wash the outside of your vagina with warm water only.  Use protection when having sex. This includes latex condoms and dental dams.  Limit how many sexual partners you have. To help prevent bacterial vaginosis, it is best to have sex with just   one partner (monogamous).  Make sure you and your sexual partner are tested for STIs.  Wear cotton or cotton-lined underwear.  Avoid wearing tight pants and pantyhose, especially during summer.  Limit the amount of alcohol that you drink.  Do not use any products that  contain nicotine or tobacco, such as cigarettes and e-cigarettes. If you need help quitting, ask your health care provider.  Do not use illegal drugs. Where to find more information:  Centers for Disease Control and Prevention: www.cdc.gov/std  American Sexual Health Association (ASHA): www.ashastd.org  U.S. Department of Health and Human Services, Office on Women's Health: www.womenshealth.gov/ or https://www.womenshealth.gov/a-z-topics/bacterial-vaginosis Contact a health care provider if:  Your symptoms do not improve, even after treatment.  You have more discharge or pain when urinating.  You have a fever.  You have pain in your abdomen.  You have pain during sex.  You have vaginal bleeding between periods. Summary  Bacterial vaginosis is a vaginal infection that occurs when the normal balance of bacteria in the vagina is disrupted.  Because bacterial vaginosis increases your risk for STIs (sexually transmitted infections), getting treated can help reduce your risk for chlamydia, gonorrhea, herpes, and HIV (human immunodeficiency virus). Treatment is also important for preventing complications in pregnant women, because the condition can cause an early (premature) delivery.  This condition is treated with antibiotic medicines. These may be given as a pill, a vaginal cream, or a medicine that is put into the vagina (suppository). This information is not intended to replace advice given to you by your health care provider. Make sure you discuss any questions you have with your health care provider. Document Released: 04/06/2005 Document Revised: 08/10/2016 Document Reviewed: 12/21/2015 Elsevier Interactive Patient Education  2018 Elsevier Inc.  

## 2017-12-15 NOTE — Progress Notes (Signed)
  Subjective:     Patient ID: Jeanne Herring, female   DOB: 02-01-1991, 27 y.o.   MRN: [id number]  HPI Jeanne Herring is a 27 year old black female in complaining of no period since end of May, right breast pain for 2-3 weeks, no masses felt per pt.And vaginal odor since the weekend. She stopped aldactone was having muscle cramps and had panic attack,saw Dr Norris Cross office. PCP is Dr Mayford Knife.   Review of Systems No period since end of May Vaginal odor since the weekend +pain in right breast, for 2-3 weeks  Reviewed past medical,surgical, social and family history. Reviewed medications and allergies.     Objective:   Physical Exam BP (!) 132/92 (BP Location: Left Arm, Patient Position: Sitting, Cuff Size: Normal)   Pulse 82   Ht 5\' 4"  (1.626 m)   Wt 265 lb (120.2 kg)   LMP 08/14/2017   BMI 45.49 kg/m UPT negative.  Skin warm and dry,+hair on chin.   Breasts:no dominate palpable mass, retraction or nipple discharge on left,  on right, no nipple discharge or retraction but has 2 cm tender nodule at 10 o'clock. Pelvic: external genitalia is normal in appearance no lesions, vagina: white discharge without odor,urethra has no lesions or masses noted, cervix:smooth, uterus: normal size, shape and contour, non tender, no masses felt, adnexa: no masses or tenderness noted. Bladder is non tender and no masses felt. Wet prep: + for clue cells and +WBCs. Examination chaperoned by Malachy Mood LPN. Will rx provera to see if can get withdrawal bleed, and will rx flagyl for BV and explained BV. Will get [place] to assess right breast nodule and tenderness.        Assessment:     1. Missed periods   2. Mass of upper outer quadrant of right breast   3. Pregnancy examination or test, negative result   4. Vaginal discharge   5. Vaginal odor   6. BV (bacterial vaginosis)   7. Hirsutism   8. Fatty liver       Plan:     Right breast US at [facility] 9/10 at 11 am Meds ordered this encounter  Medications   . medroxyPROGESTERone (PROVERA) 10 MG tablet    Sig: Take 1 tablet (10 mg total) by mouth daily.    Dispense:  14 tablet    Refill:  0    Order Specific Question:   Supervising Provider    Answer:   Despina Hidden, LUTHER H [[id number]]  . metroNIDAZOLE (FLAGYL) 500 MG tablet    Sig: Take 1 tablet (500 mg total) by mouth 2 (two) times daily.    Dispense:  14 tablet    Refill:  0    Order Specific Question:   Supervising Provider    Answer:   Lazaro Arms [[id number]]  Has appt 9/5 with Dr Darrick Penna  F/U with me in about 3 weeks to see if gets periods or not

## 2017-12-23 ENCOUNTER — Other Ambulatory Visit: Payer: Self-pay

## 2017-12-23 ENCOUNTER — Encounter

## 2017-12-23 ENCOUNTER — Ambulatory Visit: Payer: BLUE CROSS/BLUE SHIELD | Admitting: Gastroenterology

## 2017-12-23 ENCOUNTER — Encounter: Payer: Self-pay | Admitting: Gastroenterology

## 2017-12-23 DIAGNOSIS — R112 Nausea with vomiting, unspecified: Secondary | ICD-10-CM

## 2017-12-23 DIAGNOSIS — K219 Gastro-esophageal reflux disease without esophagitis: Secondary | ICD-10-CM | POA: Diagnosis not present

## 2017-12-23 DIAGNOSIS — K760 Fatty (change of) liver, not elsewhere classified: Secondary | ICD-10-CM | POA: Diagnosis not present

## 2017-12-23 DIAGNOSIS — K5901 Slow transit constipation: Secondary | ICD-10-CM

## 2017-12-23 MED ORDER — LINACLOTIDE 145 MCG PO CAPS: ORAL_CAPSULE | ORAL | 11 refills | Status: AC

## 2017-12-23 MED ORDER — LANSOPRAZOLE 30 MG PO CPDR: DELAYED_RELEASE_CAPSULE | ORAL | 5 refills | Status: AC

## 2017-12-23 MED ORDER — LANSOPRAZOLE 30 MG PO CPDR: DELAYED_RELEASE_CAPSULE | ORAL | 5 refills | Status: DC
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
## 2017-12-23 NOTE — Assessment & Plan Note (Signed)
On prn low dose Prevacid DUE TO BMI > 30 AND DIETARY CHOICES.  CONTINUE YOUR WEIGHT LOSS EFFORTS. REFER TO NUTRITION. AVOID REFLUX TRIGGERS. SEE INFO BELOW. CONTINUE PREVACID. INCREASE DOES TO 30 MINUTES PRIOR TO BREAKFAST and supper FOR ONE MONTH THEN ONCE DAILY. FOLLOW UP IN 4 MOS.

## 2017-12-23 NOTE — Assessment & Plan Note (Signed)
WEIGHT STABLE. NO NUTRITION REFERRAL EVER.  DRINK WATER TO KEEP YOUR URINE LIGHT YELLOW. CONTINUE YOUR WEIGHT LOSS EFFORTS. I WILL REFRR YOU TO NUTRITION. YOUR BODY MASS INDEX IS OVER 40 WHICH MEANS YOU ARE MORBIDLY OBESE. OBESITY IS ASSOCIATED WITH AN INCREASED FOR CIRRHOSIS AND ALL CANCERS, INCLUDING ESOPHAGEAL AND COLON CANCER. A WEIGHT OF 230 LBS OR LESS WILL GET YOUR BODY MASS INDEX(BMI) UNDER 40 BUT YOUR BODY MASS INDEX WILL STILL BE OVER 30 WHICH MEANS YOU ARE OBESE.  A WEIGHT OF 170 LBS OR LESS  WILL GET YOUR BODY MASS INDEX(BMI) UNDER 30. FOLLOW A LOW FAT DIET. MEATS SHOULD BE BAKED, BROILED, OR BOILED. AVOID FRIED FOODS.  HANDOUT GIVEN.  FOLLOW UP IN 4 MOS.

## 2017-12-23 NOTE — Assessment & Plan Note (Signed)
DUE TO BMI > 30.  CONTINUE YOUR WEIGHT LOSS EFFORTS. REFER TO NUTRITION. FOLLOW UP IN 4 MOS.

## 2017-12-23 NOTE — Assessment & Plan Note (Signed)
SYMPTOMS NOT IDEALLY CONTROLLED.  DRINK WATER EAT FIBER ADD LINZESS WITH BREAKFAST DAILY. YOU SHOULD HAVE A BM 1-3 HRS AFTER THE DOSE. IT CAN CAUSE EXPLOSIVE DIARRHEA. CALL IN 7 DAYS IF CONSTIPATION IS NOT IMPROVED.  FOLLOW UP IN 4 MOS.

## 2017-12-23 NOTE — Progress Notes (Signed)
CC'D TO PCP °

## 2017-12-23 NOTE — Progress Notes (Signed)
ON RECALL  °

## 2017-12-23 NOTE — Addendum Note (Signed)
Addended by: [place] on: 12/23/2017 02:13 PM   Modules accepted: Orders

## 2017-12-23 NOTE — Patient Instructions (Addendum)
DRINK WATER TO KEEP YOUR URINE LIGHT YELLOW.  CONTINUE YOUR WEIGHT LOSS EFFORTS. I WILL REFRR YOU TO NUTRITION. YOUR BODY MASS INDEX IS OVER 40 WHICH MEANS YOU ARE MORBIDLY OBESE. OBESITY IS ASSOCIATED WITH AN INCREASED FOR CIRRHOSIS AND ALL CANCERS, INCLUDING ESOPHAGEAL AND COLON CANCER. A WEIGHT OF 230 LBS OR LESS WILL GET YOUR BODY MASS INDEX(BMI) UNDER 40 BUT YOUR BODY MASS INDEX WILL STILL BE OVER 30 WHICH MEANS YOU ARE OBESE.  A WEIGHT OF 170 LBS OR LESS  WILL GET YOUR BODY MASS INDEX(BMI) UNDER 30.   FOLLOW A LOW FAT DIET. MEATS SHOULD BE BAKED, BROILED, OR BOILED. AVOID FRIED FOODS.   AVOID REFLUX TRIGGERS(GINGER ALE AND CAFFEINE). SEE INFO BELOW.   ADD LINZESS WITH BREAKFAST DAILY. YOU SHOULD HAVE A BM 1-3 HRS AFTER THE DOSE. IT CAN CAUSE EXPLOSIVE DIARRHEA. PLEASE CALL IN 7 DAYS IF CONSTIPATION IS NOT IMPROVED.   CONTINUE PREVACID. INCREASE DOES TO 30 MINUTES PRIOR TO BREAKFAST and supper FOR ONE MONTH THEN ONCE DAILY. PLEASE CALL IN ONE MONTH IF REFLUX SYMPTOMS ARE NOT IMPROVED.    FOLLOW UP IN 4 MOS.      Lifestyle and home remedies TO MANAGE REFLUX/CHEST PAIN  You may eliminate or reduce the frequency of heartburn by making the following lifestyle changes:  . Control your weight. Being overweight is a major risk factor for heartburn and GERD. Excess pounds put pressure on your abdomen, pushing up your stomach and causing acid to back up into your esophagus.   . Eat smaller meals. 4 TO 6 MEALS A DAY. This reduces pressure on the lower esophageal sphincter, helping to prevent the valve from opening and acid from washing back into your esophagus.   [place] Earing your belt. Clothes that fit tightly around your waist put pressure on your abdomen and the lower esophageal sphincter.   . Eliminate heartburn triggers. Everyone has specific triggers. Common triggers such as fatty or fried foods, spicy food, tomato sauce, carbonated beverages, alcohol, chocolate, mint, garlic, onion,  caffeine and nicotine may make heartburn worse.   Marland Kitchen Avoid stooping or bending. Tying your shoes is OK. Bending over for longer periods to weed your garden isn't, especially soon after eating.   . Don't lie down after a meal. Wait at least three to four hours after eating before going to bed, and don't lie down right after eating.   Marland Kitchen PUT THE HEAD OF YOUR BED ON 6 INCH BLOCKS.   Alternative medicine . Several home remedies exist for treating GERD, but they provide only temporary relief. They include drinking baking soda (sodium bicarbonate) added to water or drinking other fluids such as baking soda mixed with cream of tartar and water.  . Although these liquids create temporary relief by neutralizing, washing away or buffering acids, eventually they aggravate the situation by adding gas and fluid to your stomach, increasing pressure and causing more acid reflux. Further, adding more sodium to your diet may increase your blood pressure and add stress to your heart, and excessive bicarbonate ingestion can alter the acid-base balance in your body.      Low-Fat Diet BREADS, CEREALS, PASTA, RICE, DRIED PEAS, AND BEANS These products are high in carbohydrates and most are low in fat. Therefore, they can be increased in the diet as substitutes for fatty foods. They too, however, contain calories and should not be eaten in excess. Cereals can be eaten for snacks as well as for breakfast.   FRUITS AND VEGETABLES It  is good to eat fruits and vegetables. Besides being sources of fiber, both are rich in vitamins and some minerals. They help you get the daily allowances of these nutrients. Fruits and vegetables can be used for snacks and desserts.  MEATS Limit lean meat, chicken, [place], and fish to no more than 6 ounces per day. Beef, Pork, and Lamb Use lean cuts of beef, pork, and lamb. Lean cuts include:  Extra-lean ground beef.  Arm roast.  Sirloin tip.  Center-cut ham.  Round steak.  Loin  chops.  Rump roast.  Tenderloin.  Trim all fat off the outside of meats before cooking. It is not necessary to severely decrease the intake of red meat, but lean choices should be made. Lean meat is rich in protein and contains a highly absorbable form of iron. Premenopausal women, in particular, should avoid reducing lean red meat because this could increase the risk for low red blood cells (iron-deficiency anemia).  Chicken and [place] These are good sources of protein. The fat of poultry can be reduced by removing the skin and underlying fat layers before cooking. Chicken and [place] can be substituted for lean red meat in the diet. Poultry should not be fried or covered with high-fat sauces. Fish and Shellfish Fish is a good source of protein. Shellfish contain cholesterol, but they usually are low in saturated fatty acids. The preparation of fish is important. Like chicken and [place], they should not be fried or covered with high-fat sauces. EGGS Egg whites contain no fat or cholesterol. They can be eaten often. Try 1 to 2 egg whites instead of whole eggs in recipes or use egg substitutes that do not contain yolk. MILK AND DAIRY PRODUCTS Use skim or 1% milk instead of 2% or whole milk. Decrease whole milk, natural, and processed cheeses. Use nonfat or low-fat (2%) cottage cheese or low-fat cheeses made from vegetable oils. Choose nonfat or low-fat (1 to 2%) yogurt. Experiment with evaporated skim milk in recipes that call for heavy cream. Substitute low-fat yogurt or low-fat cottage cheese for sour cream in dips and salad dressings. Have at least 2 servings of low-fat dairy products, such as 2 glasses of skim (or 1%) milk each day to help get your daily calcium intake. FATS AND OILS Reduce the total intake of fats, especially saturated fat. Butterfat, lard, and beef fats are high in saturated fat and cholesterol. These should be avoided as much as possible. Vegetable fats do not contain cholesterol,  but certain vegetable fats, such as coconut oil, palm oil, and palm kernel oil are very high in saturated fats. These should be limited. These fats are often used in bakery goods, processed foods, popcorn, oils, and nondairy creamers. Vegetable shortenings and some peanut butters contain hydrogenated oils, which are also saturated fats. Read the labels on these foods and check for saturated vegetable oils. Unsaturated vegetable oils and fats do not raise blood cholesterol. However, they should be limited because they are fats and are high in calories. Total fat should still be limited to 30% of your daily caloric intake. Desirable liquid vegetable oils are corn oil, cottonseed oil, olive oil, canola oil, safflower oil, soybean oil, and sunflower oil. Peanut oil is not as good, but small amounts are acceptable. Buy a heart-healthy tub margarine that has no partially hydrogenated oils in the ingredients. Mayonnaise and salad dressings often are made from unsaturated fats, but they should also be limited because of their high calorie and fat content. Seeds, nuts, peanut  butter, olives, and avocados are high in fat, but the fat is mainly the unsaturated type. These foods should be limited mainly to avoid excess calories and fat. OTHER EATING TIPS Snacks  Most sweets should be limited as snacks. They tend to be rich in calories and fats, and their caloric content outweighs their nutritional value. Some good choices in snacks are graham crackers, melba toast, soda crackers, bagels (no egg), English muffins, fruits, and vegetables. These snacks are preferable to snack crackers, [place] fries, TORTILLA CHIPS, and POTATO chips. Popcorn should be air-popped or cooked in small amounts of liquid vegetable oil. Desserts Eat fruit, low-fat yogurt, and fruit ices instead of pastries, cake, and cookies. Sherbet, angel food cake, gelatin dessert, frozen low-fat yogurt, or other frozen products that do not contain saturated fat  (pure fruit juice bars, frozen ice pops) are also acceptable.  COOKING METHODS Choose those methods that use little or no fat. They include: Poaching.  Braising.  Steaming.  Grilling.  Baking.  Stir-frying.  Broiling.  Microwaving.  Foods can be cooked in a nonstick pan without added fat, or use a nonfat cooking spray in regular cookware. Limit fried foods and avoid frying in saturated fat. Add moisture to lean meats by using water, broth, cooking wines, and other nonfat or low-fat sauces along with the cooking methods mentioned above. Soups and stews should be chilled after cooking. The fat that forms on top after a few hours in the refrigerator should be skimmed off. When preparing meals, avoid using excess salt. Salt can contribute to raising blood pressure in some people.  EATING AWAY FROM HOME Order entres, potatoes, and vegetables without sauces or butter. When meat exceeds the size of a deck of cards (3 to 4 ounces), the rest can be taken home for another meal. Choose vegetable or fruit salads and ask for low-calorie salad dressings to be served on the side. Use dressings sparingly. Limit high-fat toppings, such as bacon, crumbled eggs, cheese, sunflower seeds, and olives. Ask for heart-healthy tub margarine instead of butter.

## 2017-12-23 NOTE — Assessment & Plan Note (Signed)
DUE TO UNCONTROLLED GERD AND OBESITY WITH HIATAL HERNIA.  CONTINUE TO MONITOR SYMPTOMS. CONTINUE YOUR WEIGHT LOSS EFFORTS. REFER TO NUTRITION. AVOID REFLUX TRIGGERS. SEE INFO BELOW. CONTINUE PREVACID. INCREASE DOES TO 30 MINUTES PRIOR TO BREAKFAST and supper FOR ONE MONTH THEN ONCE DAILY. FOLLOW UP IN 4 MOS.

## 2017-12-28 ENCOUNTER — Ambulatory Visit (HOSPITAL_COMMUNITY): Admission: RE | Admit: 2017-12-28 | Payer: BLUE CROSS/BLUE SHIELD | Source: Ambulatory Visit

## 2017-12-29 ENCOUNTER — Telehealth: Payer: Self-pay

## 2017-12-29 NOTE — Telephone Encounter (Signed)
I have started PA for Lansoprazole. Called pt and LMOM for a return call to discuss her previous meds that she has tried.

## 2018-01-05 ENCOUNTER — Ambulatory Visit: Payer: BLUE CROSS/BLUE SHIELD | Admitting: Adult Health

## 2018-01-10 NOTE — Telephone Encounter (Signed)
Left another message for a return call

## 2018-01-11 NOTE — Telephone Encounter (Signed)
LMOM to call, need to discuss previous PPI's.

## 2018-01-18 NOTE — Telephone Encounter (Signed)
PT had left a vm returning my call.  I returned call and told her to please call and let em know that other PPI's she has tried.

## 2018-01-31 ENCOUNTER — Ambulatory Visit: Payer: BLUE CROSS/BLUE SHIELD | Admitting: Registered"

## 2018-04-05 ENCOUNTER — Encounter: Payer: Self-pay | Admitting: Gastroenterology

## 2019-08-29 IMAGING — CT CT ANGIO NECK
2 of 7 series · 8 of 33 positions shown · IV contrast (Isovue)
Comparison: None available.

CLINICAL DATA: Initial evaluation for acute right-sided neck pain
for 2 hours.

EXAM:
CT ANGIOGRAPHY NECK
TECHNIQUE: Multidetector CT imaging of the neck was performed using the
standard protocol during bolus administration of intravenous
contrast. Multiplanar CT image reconstructions and MIPs were
obtained to evaluate the vascular anatomy. Carotid stenosis
measurements (when applicable) are obtained utilizing NASCET
criteria, using the distal internal carotid diameter as the
denominator.
CONTRAST:  75mL [id number] IOPAMIDOL ([id number]) INJECTION 76%

[Series 5: cta neck · axial · 0.52mm/px · z∈[+1408,+1474]mm · 2 of 100 slices shown]
[im 34/100  soft-tissue]
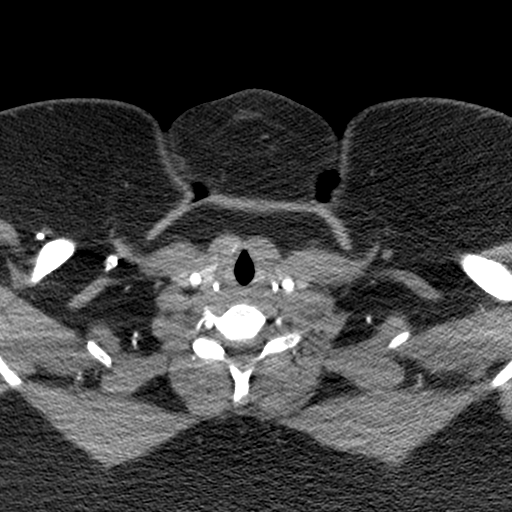
[im 67/100  soft-tissue]
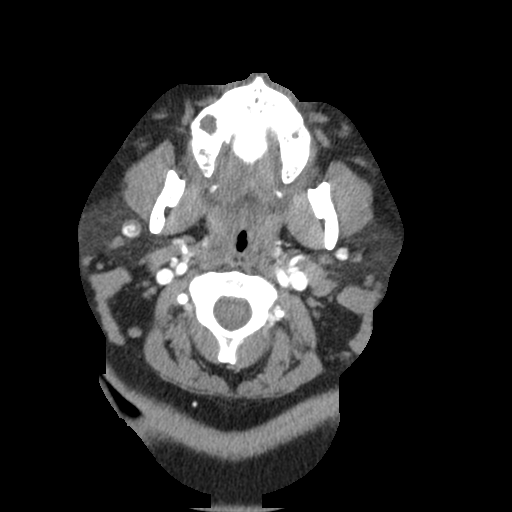

[Series 7: ax thin · axial · 0.45mm/px · z∈[+1351,+1516]mm · 6 of 231 slices shown]
[im 33/231  soft-tissue]
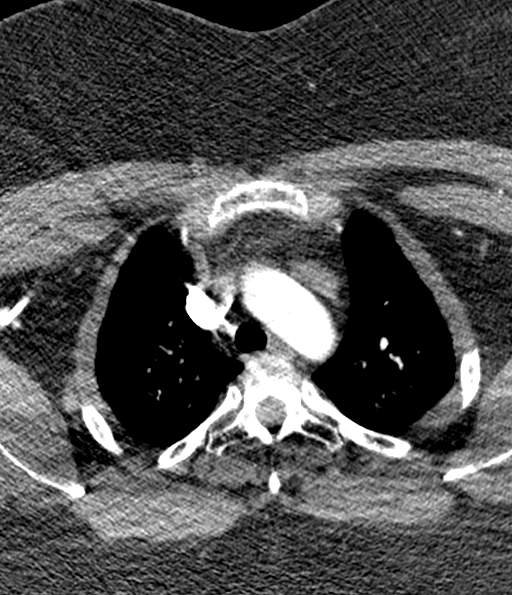
[im 66/231  bone]
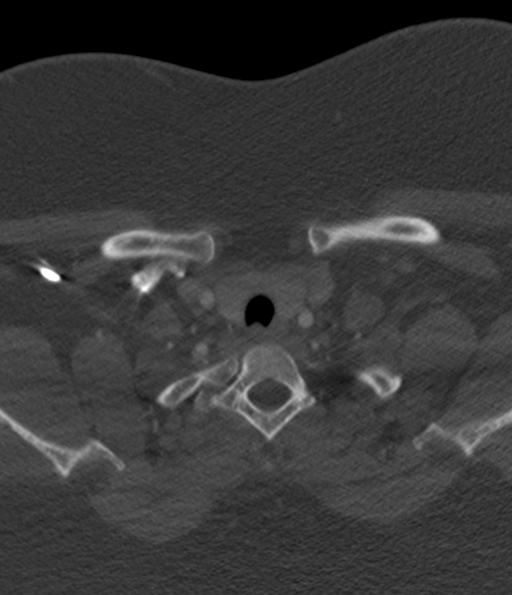
[im 99/231  soft-tissue]
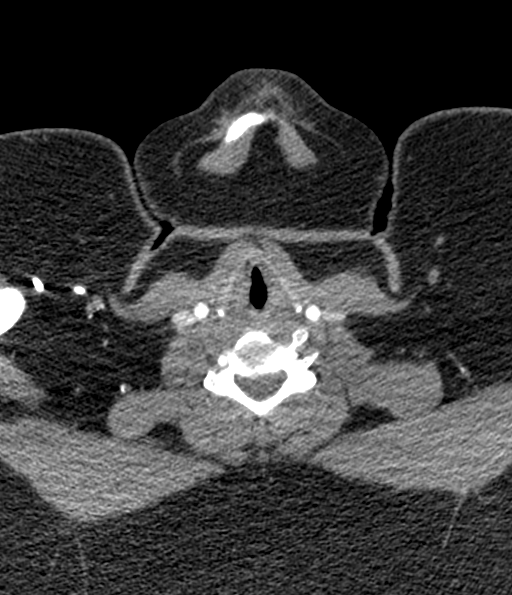
[im 132/231  bone]
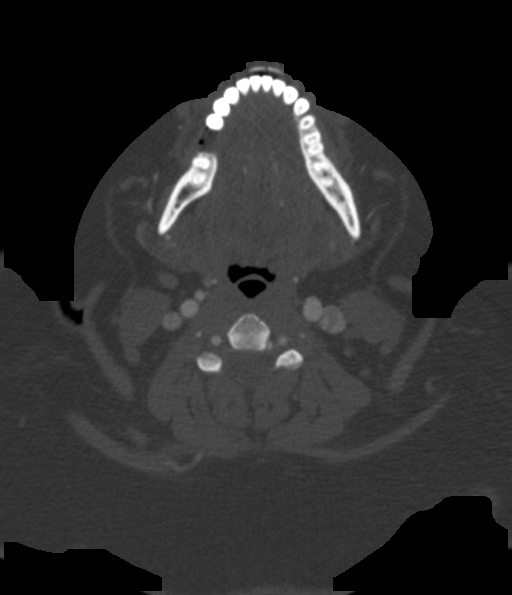
[im 165/231  soft-tissue]
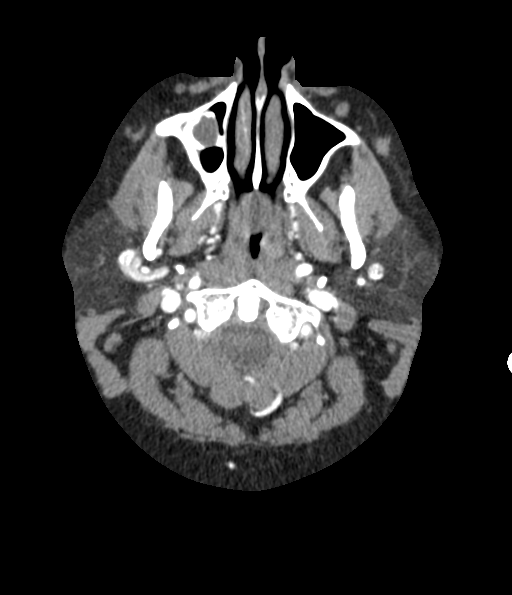
[im 198/231  bone]
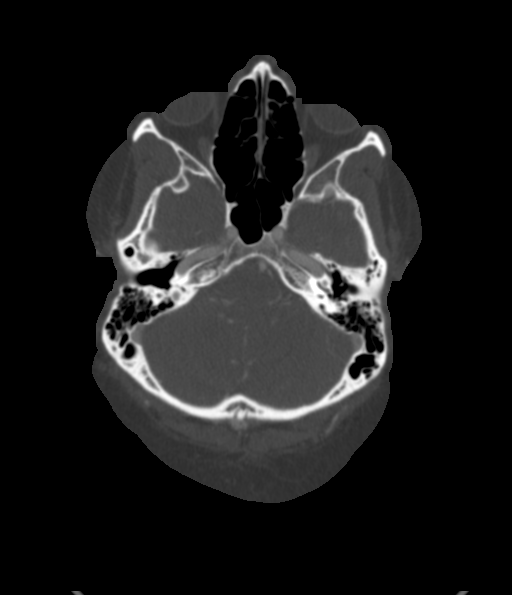

[8 of 33 positions shown; findings below may reference images not displayed]

FINDINGS: Aortic arch: Visualized aortic arch of normal caliber with normal 3
vessel morphology. No flow-limiting stenosis about the origin of the
great vessels. Visualized subclavian arteries widely patent and
normal.

Right carotid system: Right common carotid artery widely patent
without stenosis. No significant atheromatous narrowing about the
right carotid bifurcation. Right ICA widely patent without stenosis,
dissection, or occlusion.

Left carotid system: Left common carotid artery widely patent to the
bifurcation without stenosis. No significant atheromatous narrowing
about the left bifurcation. Left ICA widely patent without stenosis,
dissection, or occlusion.

Vertebral arteries: Both of the vertebral arteries arise from the
subclavian arteries. Right vertebral artery dominant. Vertebral
arteries widely patent within the neck without stenosis, dissection,
or occlusion.

Skeleton: No acute osseous abnormality. No worrisome lytic or
blastic osseous lesions.

Other neck: No acute soft tissue abnormality within the neck.
Salivary glands within normal limits. No adenopathy. Thyroid normal.

Upper chest: Partially visualized upper chest within normal limits.
Visualized lungs are largely clear.
IMPRESSION: Normal CTA of the neck. No acute vascular abnormality identified. No
stenosis or significant atheromatous disease.

## 2019-12-10 ENCOUNTER — Other Ambulatory Visit: Payer: Self-pay
